# Patient Record
Sex: Female | Born: 1960 | Race: Black or African American | Hispanic: No | Marital: Single | Smoking: Never smoker
Health system: Southern US, Community
[De-identification: ages and names within clinical notes are randomized; demographics above are authoritative.]

## PROBLEM LIST (undated history)

## (undated) DIAGNOSIS — I10 Essential (primary) hypertension: Secondary | ICD-10-CM

## (undated) DIAGNOSIS — E1165 Type 2 diabetes mellitus with hyperglycemia: Secondary | ICD-10-CM

## (undated) DIAGNOSIS — N951 Menopausal and female climacteric states: Secondary | ICD-10-CM

## (undated) DIAGNOSIS — F4323 Adjustment disorder with mixed anxiety and depressed mood: Secondary | ICD-10-CM

## (undated) DIAGNOSIS — IMO0002 Reserved for concepts with insufficient information to code with codable children: Secondary | ICD-10-CM

## (undated) DIAGNOSIS — E7800 Pure hypercholesterolemia, unspecified: Secondary | ICD-10-CM

## (undated) DIAGNOSIS — B379 Candidiasis, unspecified: Secondary | ICD-10-CM

## (undated) HISTORY — PX: MYOMECTOMY: SHX85

## (undated) HISTORY — DX: Adjustment disorder with mixed anxiety and depressed mood: F43.23

## (undated) HISTORY — DX: Menopausal and female climacteric states: N95.1

## (undated) HISTORY — DX: Pure hypercholesterolemia, unspecified: E78.00

## (undated) HISTORY — DX: Reserved for concepts with insufficient information to code with codable children: IMO0002

## (undated) HISTORY — DX: Essential (primary) hypertension: I10

## (undated) HISTORY — DX: Candidiasis, unspecified: B37.9

## (undated) HISTORY — PX: CHOLECYSTECTOMY: SHX55

## (undated) HISTORY — DX: Type 2 diabetes mellitus with hyperglycemia: E11.65

## (undated) HISTORY — PX: ABDOMINAL HYSTERECTOMY: SHX81

---

## 2008-06-28 ENCOUNTER — Ambulatory Visit: Payer: Self-pay | Admitting: Diagnostic Radiology

## 2008-06-28 ENCOUNTER — Emergency Department (HOSPITAL_BASED_OUTPATIENT_CLINIC_OR_DEPARTMENT_OTHER): Admission: EM | Admit: 2008-06-28 | Discharge: 2008-06-28 | Payer: Self-pay | Admitting: Emergency Medicine
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
## 2017-01-25 ENCOUNTER — Institutional Professional Consult (permissible substitution): Payer: Managed Care, Other (non HMO) | Admitting: Neurology

## 2017-01-25 ENCOUNTER — Telehealth: Payer: Self-pay

## 2017-01-25 NOTE — Telephone Encounter (Signed)
Pt did not show for their appt with Dr. Athar today.  

## 2017-01-26 ENCOUNTER — Encounter: Payer: Self-pay | Admitting: Neurology

## 2017-03-10 ENCOUNTER — Ambulatory Visit (INDEPENDENT_AMBULATORY_CARE_PROVIDER_SITE_OTHER): Payer: Managed Care, Other (non HMO) | Admitting: Neurology

## 2017-03-10 ENCOUNTER — Encounter: Payer: Self-pay | Admitting: Neurology

## 2017-03-10 ENCOUNTER — Encounter (INDEPENDENT_AMBULATORY_CARE_PROVIDER_SITE_OTHER): Payer: Self-pay

## 2017-03-10 VITALS — BP 139/78 | HR 84 | Ht 60.0 in | Wt 194.0 lb

## 2017-03-10 DIAGNOSIS — Z820 Family history of epilepsy and other diseases of the nervous system: Secondary | ICD-10-CM | POA: Diagnosis not present

## 2017-03-10 DIAGNOSIS — G4719 Other hypersomnia: Secondary | ICD-10-CM | POA: Diagnosis not present

## 2017-03-10 DIAGNOSIS — E669 Obesity, unspecified: Secondary | ICD-10-CM | POA: Diagnosis not present

## 2017-03-10 DIAGNOSIS — G4733 Obstructive sleep apnea (adult) (pediatric): Secondary | ICD-10-CM

## 2017-03-10 DIAGNOSIS — R351 Nocturia: Secondary | ICD-10-CM

## 2017-03-10 NOTE — Patient Instructions (Addendum)
Based on your symptoms and your exam I believe you are still at risk for obstructive sleep apnea or OSA, and I think we should proceed with a sleep study to determine whether you do or do not still have OSA and how severe it is. If you have more than mild OSA, I want you to consider treatment with CPAP. Please remember, the risks and ramifications of moderate to severe obstructive sleep apnea or OSA are: Cardiovascular disease, including congestive heart failure, stroke, difficult to control hypertension, arrhythmias, and even type 2 diabetes has been linked to untreated OSA. Sleep apnea causes disruption of sleep and sleep deprivation in most cases, which, in turn, can cause recurrent headaches, problems with memory, mood, concentration, focus, and vigilance. Most people with untreated sleep apnea report excessive daytime sleepiness, which can affect their ability to drive. Please do not drive if you feel sleepy.   I will likely see you back after your sleep study to go over the test results and where to go from there. We will call you after your sleep study to advise about the results (most likely, you will hear from Lafonda Mosses, my nurse) and to set up an appointment at the time, as necessary.    Our sleep lab administrative assistant, Alvis Lemmings will meet with you or call you to schedule your sleep study. If you don't hear back from her by about 2 weeks please feel free to call her at [phone]. This is her direct line and please leave a message with your phone number to call back if you get the voicemail box. She will call back as soon as possible.
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
## 2017-04-11 ENCOUNTER — Telehealth: Payer: Self-pay | Admitting: Neurology

## 2017-04-11 DIAGNOSIS — G4733 Obstructive sleep apnea (adult) (pediatric): Secondary | ICD-10-CM

## 2017-04-11 NOTE — Telephone Encounter (Signed)
[employer] denied Split sleep and approved HST.  Can I get an order for HST? °

## 2017-04-25 ENCOUNTER — Ambulatory Visit (INDEPENDENT_AMBULATORY_CARE_PROVIDER_SITE_OTHER): Payer: Managed Care, Other (non HMO) | Admitting: Neurology

## 2017-04-25 DIAGNOSIS — G4733 Obstructive sleep apnea (adult) (pediatric): Secondary | ICD-10-CM

## 2017-05-02 ENCOUNTER — Telehealth: Payer: Self-pay | Admitting: Neurology

## 2017-05-02 DIAGNOSIS — G4733 Obstructive sleep apnea (adult) (pediatric): Secondary | ICD-10-CM

## 2017-05-02 NOTE — Procedures (Signed)
  [facility] [address] [place], [place] [address] NAME: Cathy Noble, Cathy Noble      DOB: [birth year] MEDICAL RECORD [id number]   DOS: 04/25/17 REFERRING PHYSICIAN: Suzann Hedgecock, PA-C STUDY PERFORMED: Home Sleep Study HISTORY: 56 year old woman with a history of diabetes, hypertension, hyperlipidemia, and morbid obesity, who was previously diagnosed with OSA. She has not used CPAP in over a year and needs re-evaluation. Her Epworth sleepiness score is 13 out of 24.   STUDY RESULTS: Total Recording:     8 Hours, 52 Minutes Total Apnea/Hypopnea Index (AHI):  37.1/Hour Average Oxygen Saturation:      94% Lowest Oxygen Saturation:       72%  Time Oxygen Saturation Below 88%:  6 % (37 min) Average Heart Rate:     75 BPM IMPRESSION: OSA RECOMMENDATION: This study demonstrates severe obstructive sleep apnea, with a total AHI of 37.1/hour, and O2 nadir of 72%. Treatment with positive airway pressure in the form of CPAP is recommended. This will require a full night titration study to optimize therapy. Other treatment options may include avoidance of supine sleep position along with weight loss, upper airway or jaw surgery in selected patients or the use of an oral appliance in certain patients. ENT evaluation and/or consultation with a maxillofacial surgeon or dentist may be feasible in some instances. Please note that untreated obstructive sleep apnea carries additional perioperative morbidity. Patients with significant obstructive sleep apnea should receive perioperative PAP therapy and the surgeons and particularly the anesthesiologist should be informed of the diagnosis and the severity of the sleep disordered breathing. The patient should be cautioned not to drive, work at heights, or operate dangerous or heavy equipment when tired or sleepy. Review and reiteration of good sleep hygiene measures should be pursued with any patient. I certify that I have reviewed the raw  data recording prior to the issuance of this report in accordance with the standards of the American Academy of Sleep Medicine (AASM). Huston Foley, MD, PhD [facility]) Diplomat, ABPN (Neurology and Sleep)

## 2017-05-02 NOTE — Telephone Encounter (Signed)
Could not do result note, I believe HST order placed by Dr. Lucia Gaskins on my behalf? Patient referred by PCP, seen by me on 03/10/17, HST on 04/25/17:  Please call and notify the patient that the recent home sleep test did suggest the diagnosis of severe obstructive sleep apnea and that I recommend treatment for this in the form of CPAP (she has not had supplies or used her old CPAP in over a year). I will request an overnight sleep study for proper titration and mask fitting. Please explain to patient and arrange for a CPAP titration study. I have placed an order in the chart. Thanks, and please route to [facility] for scheduling.   Huston Foley, MD, PhD [facility])

## 2017-05-03 NOTE — Telephone Encounter (Signed)
I called pt. I advised pt that Dr. Frances Furbish reviewed their sleep study results and found that pt has severe osa and recommends treatment with a cpap. Dr. Frances Furbish recommends that pt return for a repeat sleep study in order to properly titrate the cpap and ensure a good mask fit. Pt is agreeable to returning for a titration study. I advised pt that our sleep lab will file with pt's insurance and call pt to schedule the sleep study when we hear back from the pt's insurance regarding coverage of this sleep study. Pt verbalized understanding of results. Pt had no questions at this time but was encouraged to call back if questions arise.

## 2017-05-16 ENCOUNTER — Telehealth: Payer: Self-pay | Admitting: Neurology

## 2017-05-16 DIAGNOSIS — G4733 Obstructive sleep apnea (adult) (pediatric): Secondary | ICD-10-CM

## 2017-05-16 NOTE — Telephone Encounter (Signed)
[employer] denied CPAP titration °

## 2017-05-17 NOTE — Telephone Encounter (Signed)
We will set patient up with autoPAP at home, as insurance denied in house titration study for SEVERE OSA. Pls process order and notify patient and set up FU in 10 weeks with me or NP.

## 2017-05-19 NOTE — Telephone Encounter (Signed)
Unable to get in contact with patient. Rn call patients home number listed, and it stated pt was unreachable. Rn will continue to call back. Rn wanted to discuss about set up autopap at home.

## 2017-05-20 ENCOUNTER — Telehealth: Payer: Self-pay | Admitting: Neurology

## 2017-05-20 NOTE — Telephone Encounter (Signed)
Attempted call to the patient but the number we had on file states the patient was unreachable. I have contacted her sister who provided me with an updated number for the patient. I have called the new number [phone] at this time but the voicemail box has not been set up and there was no answer. Will attempt to call the patient at another time.

## 2017-05-20 NOTE — Telephone Encounter (Signed)
Attempted call to the patient but the number we had on file states the patient was unreachable. I have contacted her sister who provided me with an updated number for the patient. I have called the new number [phone] at this time but the voicemail box has not been set up and there was no answer. Will attempt to call the patient at another time.  

## 2017-05-23 NOTE — Telephone Encounter (Signed)
Called the patient and was able to discuss that insurance denied the cpap titration test. I informed her that Dr Frances FurbishAthar is recommending that we start her on a auto CPAP. I reviewed PAP compliance expectations with the pt. Pt is agreeable to starting a CPAP. I advised pt that an order will be sent to a DME, [facility], and [facility] will call the pt within about one week after they file with the pt's insurance. [facility] will show the pt how to use the machine, fit for masks, and troubleshoot the CPAP if needed. A follow up appt was made for insurance purposes with Dr. Frances FurbishAthar on Aug 17, 2017 at 11:30 am. Pt verbalized understanding to arrive 15 minutes early and bring their CPAP. A letter with all of this information in it will be mailed to the pt as a reminder. I verified with the pt that the address we have on file is correct. Pt verbalized understanding of results. Pt had no questions at this time but was encouraged to call back if questions arise.

## 2017-06-30 ENCOUNTER — Telehealth: Payer: Self-pay | Admitting: Neurology

## 2017-06-30 NOTE — Telephone Encounter (Signed)
We do not complete PAs for cpaps. I have reached out to [facility] and asked them to call her to discuss.

## 2017-06-30 NOTE — Telephone Encounter (Signed)
I called pt to discuss, no answer and no VM set up.  I have no idea what an intake number is. Is pt asking for [facility]'s number? If so, it is [phone].  I will attempt pt again later.

## 2017-06-30 NOTE — Telephone Encounter (Signed)
Pt is asking for a call back with the Intake # that she was given once before but has misplaced.

## 2017-06-30 NOTE — Telephone Encounter (Signed)
Pt called back, said she has waited too long to get in touch with [facility] and they are requesting a PA. Pt said her father passed, she was too preoccupied and forgot about it

## 2017-06-30 NOTE — Telephone Encounter (Signed)
Received this notice from [facility]: "Cathy Noble spoke with her this morning and explained that the Auth we received expired and that she is in the process of getting it renewed and will call her back to schedule by the first of the week if not by tomorrow."

## 2017-08-17 ENCOUNTER — Ambulatory Visit: Payer: Self-pay | Admitting: Neurology

## 2017-11-03 ENCOUNTER — Ambulatory Visit: Payer: Self-pay | Admitting: Neurology

## 2017-12-08 ENCOUNTER — Ambulatory Visit: Payer: Self-pay | Admitting: Neurology

## 2017-12-09 ENCOUNTER — Encounter: Payer: Self-pay | Admitting: Neurology

## 2017-12-27 ENCOUNTER — Emergency Department (HOSPITAL_BASED_OUTPATIENT_CLINIC_OR_DEPARTMENT_OTHER): Payer: Managed Care, Other (non HMO)

## 2017-12-27 ENCOUNTER — Other Ambulatory Visit: Payer: Self-pay

## 2017-12-27 ENCOUNTER — Encounter (HOSPITAL_BASED_OUTPATIENT_CLINIC_OR_DEPARTMENT_OTHER): Payer: Self-pay

## 2017-12-27 ENCOUNTER — Emergency Department (HOSPITAL_BASED_OUTPATIENT_CLINIC_OR_DEPARTMENT_OTHER)
Admission: EM | Admit: 2017-12-27 | Discharge: 2017-12-27 | Disposition: A | Discharge status: Home / Self Care | Payer: Managed Care, Other (non HMO) | Attending: Emergency Medicine | Admitting: Emergency Medicine

## 2017-12-27 DIAGNOSIS — E7800 Pure hypercholesterolemia, unspecified: Secondary | ICD-10-CM | POA: Diagnosis not present

## 2017-12-27 DIAGNOSIS — I10 Essential (primary) hypertension: Secondary | ICD-10-CM | POA: Insufficient documentation

## 2017-12-27 DIAGNOSIS — Z7984 Long term (current) use of oral hypoglycemic drugs: Secondary | ICD-10-CM | POA: Diagnosis not present

## 2017-12-27 DIAGNOSIS — E119 Type 2 diabetes mellitus without complications: Secondary | ICD-10-CM | POA: Diagnosis not present

## 2017-12-27 DIAGNOSIS — Z7982 Long term (current) use of aspirin: Secondary | ICD-10-CM | POA: Diagnosis not present

## 2017-12-27 DIAGNOSIS — Z79899 Other long term (current) drug therapy: Secondary | ICD-10-CM | POA: Diagnosis not present

## 2017-12-27 DIAGNOSIS — L0390 Cellulitis, unspecified: Secondary | ICD-10-CM

## 2017-12-27 DIAGNOSIS — M79671 Pain in right foot: Secondary | ICD-10-CM

## 2017-12-27 MED ORDER — CEPHALEXIN 500 MG PO CAPS: 500.0000 mg | ORAL_CAPSULE | Freq: Two times a day (BID) | ORAL | 0 refills | Status: AC

## 2017-12-27 MED FILL — CEPHALEXIN 500 MG CAPSULE: 500 | 7 days supply | Qty: 14 | Fill #0

## 2017-12-27 NOTE — Discharge Instructions (Signed)
You are seen today for right foot pain.  It appears that you likely have an infection called cellulitis on your foot.  We have given you a prescription for antibiotics to take for this.  You can take Tylenol or ibuprofen as needed for pain.  Please come back if the redness or pain gets worse or you develop a fever chills, nausea. Take care!

## 2017-12-27 NOTE — ED Provider Notes (Signed)
[facility] EMERGENCY DEPARTMENT Provider Note   CSN: [id number] Arrival date & time: 12/27/17  1220     History   Chief Complaint Chief Complaint  Patient presents with  . Foot Pain    HPI Cathy Noble is a 57 y.o. female here for RIGHT foot pain  Right foot pain: Patient notes that she has had pain on the top part of her foot since she woke up from sleep yesterday morning.  She notes that she has no trauma or inciting event.  The only thing that she can think of that she was climbing a ladder at her job on Sunday, she was wearing tennis shoes and did not hit her feet at this time.  She notes that whenever she flexes or extends her foot today makes the pain worse.  She took aspirin yesterday and it did help a little bit.  She has noticed that the top of the foot has been more red and swollen.  She denies any fevers, chills, nausea, vomiting.  HPI  Past Medical History:  Diagnosis Date  . Adjustment disorder with mixed anxiety and depressed mood   . Benign essential hypertension   . High cholesterol   . Menopause syndrome   . Uncontrolled type 2 diabetes mellitus (HCC)   . Yeast infection     There are no active problems to display for this patient.   Past Surgical History:  Procedure Laterality Date  . ABDOMINAL HYSTERECTOMY    . CHOLECYSTECTOMY    . MYOMECTOMY       OB History   None      Home Medications    Prior to Admission medications   Medication Sig Start Date End Date Taking? Authorizing Provider  aspirin EC 81 MG tablet Take 81 mg by mouth daily.    [provider]  cephALEXin (KEFLEX) 500 MG capsule Take 1 capsule (500 mg total) by mouth 2 (two) times daily for 7 days. 12/27/17 01/03/18  Beaulah Dinning, MD  dapagliflozin propanediol (FARXIGA) 5 MG TABS tablet Take 5 mg by mouth daily.    [provider]  ergocalciferol (VITAMIN D2) 50000 units capsule Take 50,000 Units by mouth once a week.    [provider]   FLUoxetine (PROZAC) 20 MG capsule Take 20 mg by mouth daily.    [provider]  losartan-hydrochlorothiazide (HYZAAR) 100-12.5 MG tablet Take 1 tablet by mouth daily.    [provider]  pravastatin (PRAVACHOL) 80 MG tablet Take 80 mg by mouth daily.    [provider]  sitaGLIPtin-metformin (JANUMET) 50-1000 MG tablet Take 1 tablet by mouth 2 (two) times daily with a meal.    [provider]    Family History Family History  Problem Relation Age of Onset  . Sleep apnea Brother     Social History Social History   Tobacco Use  . Smoking status: Never Smoker  . Smokeless tobacco: Never Used  Substance Use Topics  . Alcohol use: Yes    Alcohol/week: 0.6 oz    Types: 1 Shots of liquor per week    Comment: socially  . Drug use: No     Allergies   Sulfa antibiotics and Codeine   Review of Systems Review of Systems  Constitutional: Negative for fatigue and fever.  Respiratory: Negative for chest tightness, shortness of breath and wheezing.   Cardiovascular: Negative for chest pain, palpitations and leg swelling.  Musculoskeletal: Negative for joint swelling.  Right foot pain     Physical Exam Updated Vital Signs BP 127/80 (BP Location: Right Arm)   Pulse 80   Temp 98.3 F (36.8 C) (Oral)   Resp 18   Ht 5' (1.524 m)   Wt 90.7 kg (200 lb)   SpO2 98%   BMI 39.06 kg/m   Physical Exam  Constitutional: She is oriented to person, place, and time. She appears well-developed and well-nourished.  HENT:  Head: Normocephalic.  Eyes: Pupils are equal, round, and reactive to light.  Neck: Normal range of motion.  Cardiovascular: Normal rate and regular rhythm.  Pulmonary/Chest: Effort normal.  Abdominal: She exhibits no distension.  Musculoskeletal:       Right ankle: Swelling: mild swelling on medial and lateral malleolus. Tenderness: Tender to palpation of dorsal midfoot.       Left ankle: Normal.  RightAnkle &  Foot: Inspection: visible swelling on the dorsal midfoot extending to the medial and lateral malleolus. No cchymosis, erythema, ulcers, calluses, blister Palpation: Tenderness on lateral and medial malleolus as well as the dorsal midfoot ROM: Full in plantarflexion, dorsiflexion, inversion, and eversion of the foot; flexion and extension of the toes Strength: 5/5 in all directions. Sensation: intact Vascular: intact w/ dorsalis pedis & posterior tibialis pulses 2+   Neurological: She is alert and oriented to person, place, and time.  Skin: Skin is warm and dry. Capillary refill takes less than 2 seconds.  Psychiatric: She has a normal mood and affect.     ED Treatments / Results  Labs (all labs ordered are listed, but only abnormal results are displayed) Labs Reviewed - No data to display  EKG None  Radiology Dg Foot Complete Right  Result Date: 12/27/2017 CLINICAL DATA:  Right foot pain and swelling, no injury. EXAM: RIGHT FOOT COMPLETE - 3+ VIEW COMPARISON:  None. FINDINGS: Healed fractures of the second and third proximal phalangeal shafts. There may be healed fractures involving the fourth and fifth proximal phalangeal shafts as well. Healed fracture of the distal aspect of the fifth metatarsal. No acute osseous abnormality. Soft tissue swelling. Calcaneal spurs. IMPRESSION: 1. No acute osseous abnormality. 2. Soft tissue swelling. Electronically Signed   By: Leanna Battles M.D.   On: 12/27/2017 12:53    Procedures Procedures (including critical care time)  Medications Ordered in ED Medications - No data to display   Initial Impression / Assessment and Plan / ED Course  I have reviewed the triage vital signs and the nursing notes.  Pertinent labs & imaging results that were available during my care of the patient were reviewed by me and considered in my medical decision making (see chart for details).    57-year-old female here for right foot pain.  No trauma or inciting  event.  X-ray showing no bony abnormalities.  Dorsal aspect of right midfoot does appear to be slightly swollen and erythematous, no appreciable fluctuance to suggest abscess.  Patient most likely has cellulitis.  Prescription given for Keflex.  Will take Tylenol ibuprofen as needed for pain.  Return precautions discussed.  She will follow with her PCP.  Patient stable for discharge at this time.  Final Clinical Impressions(s) / ED Diagnoses   Final diagnoses:  Foot pain, right  Cellulitis, unspecified cellulitis site    ED Discharge Orders        Ordered    cephALEXin (KEFLEX) 500 MG capsule  2 times daily     12/27/17 1356       Shakevia Sarris, MacArthur  M, MD 12/27/17 1406    Pricilla LovelessGoldston, Scott, MD 12/27/17 1524

## 2017-12-27 NOTE — ED Triage Notes (Signed)
Pt c/o pain to right foot started yesterday-denies pain-NAD-steady gait

## 2019-03-18 IMAGING — CR DG FOOT COMPLETE 3+V*R*
3 series · 3 of 3 positions shown · non-contrast
Comparison: None.

CLINICAL DATA: Right foot pain and swelling, no injury.

EXAM:
RIGHT FOOT COMPLETE - 3+ VIEW

[t foot ap right]
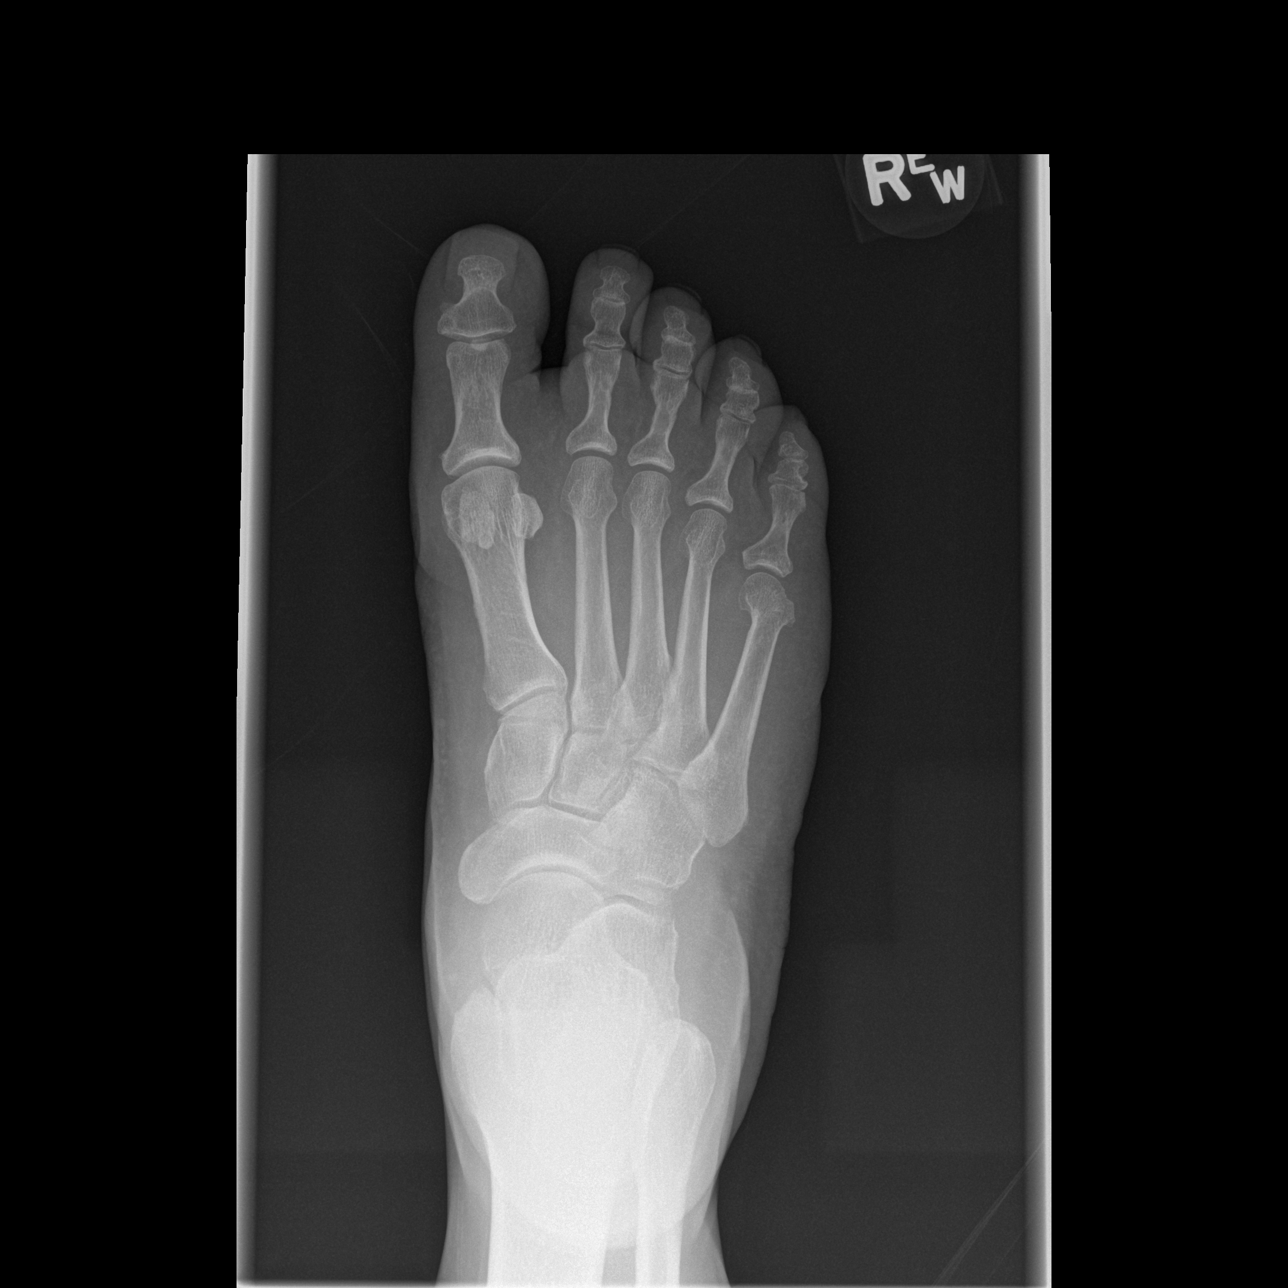

[t foot oblique right]
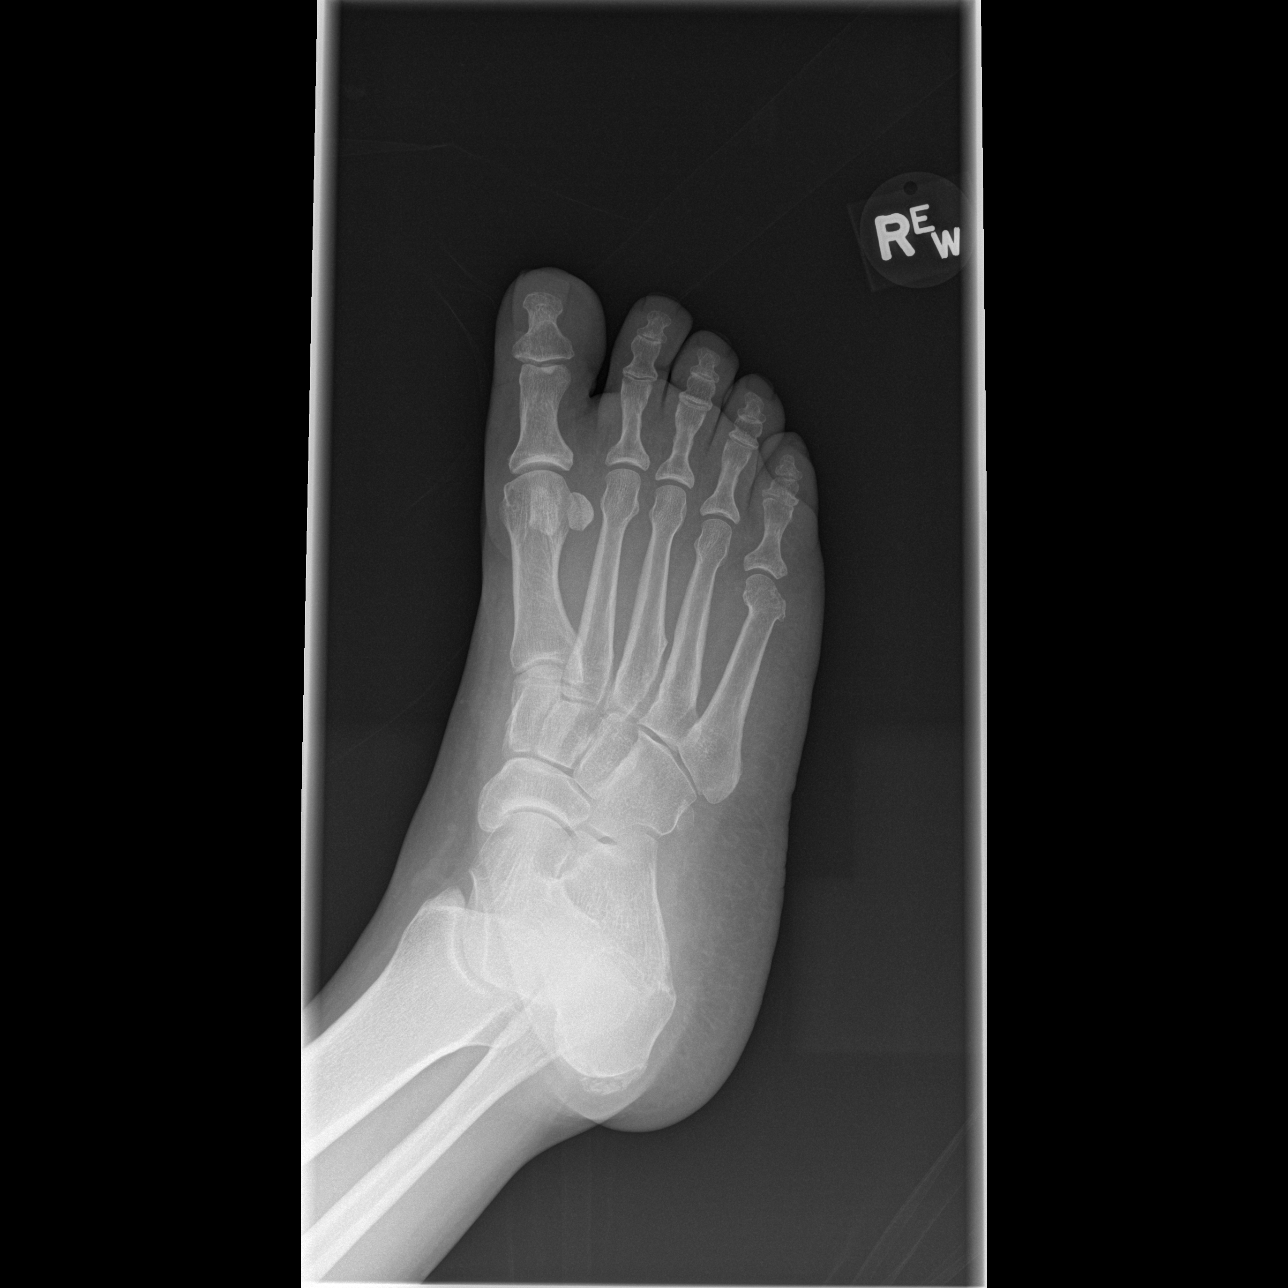

[t foot lat right]
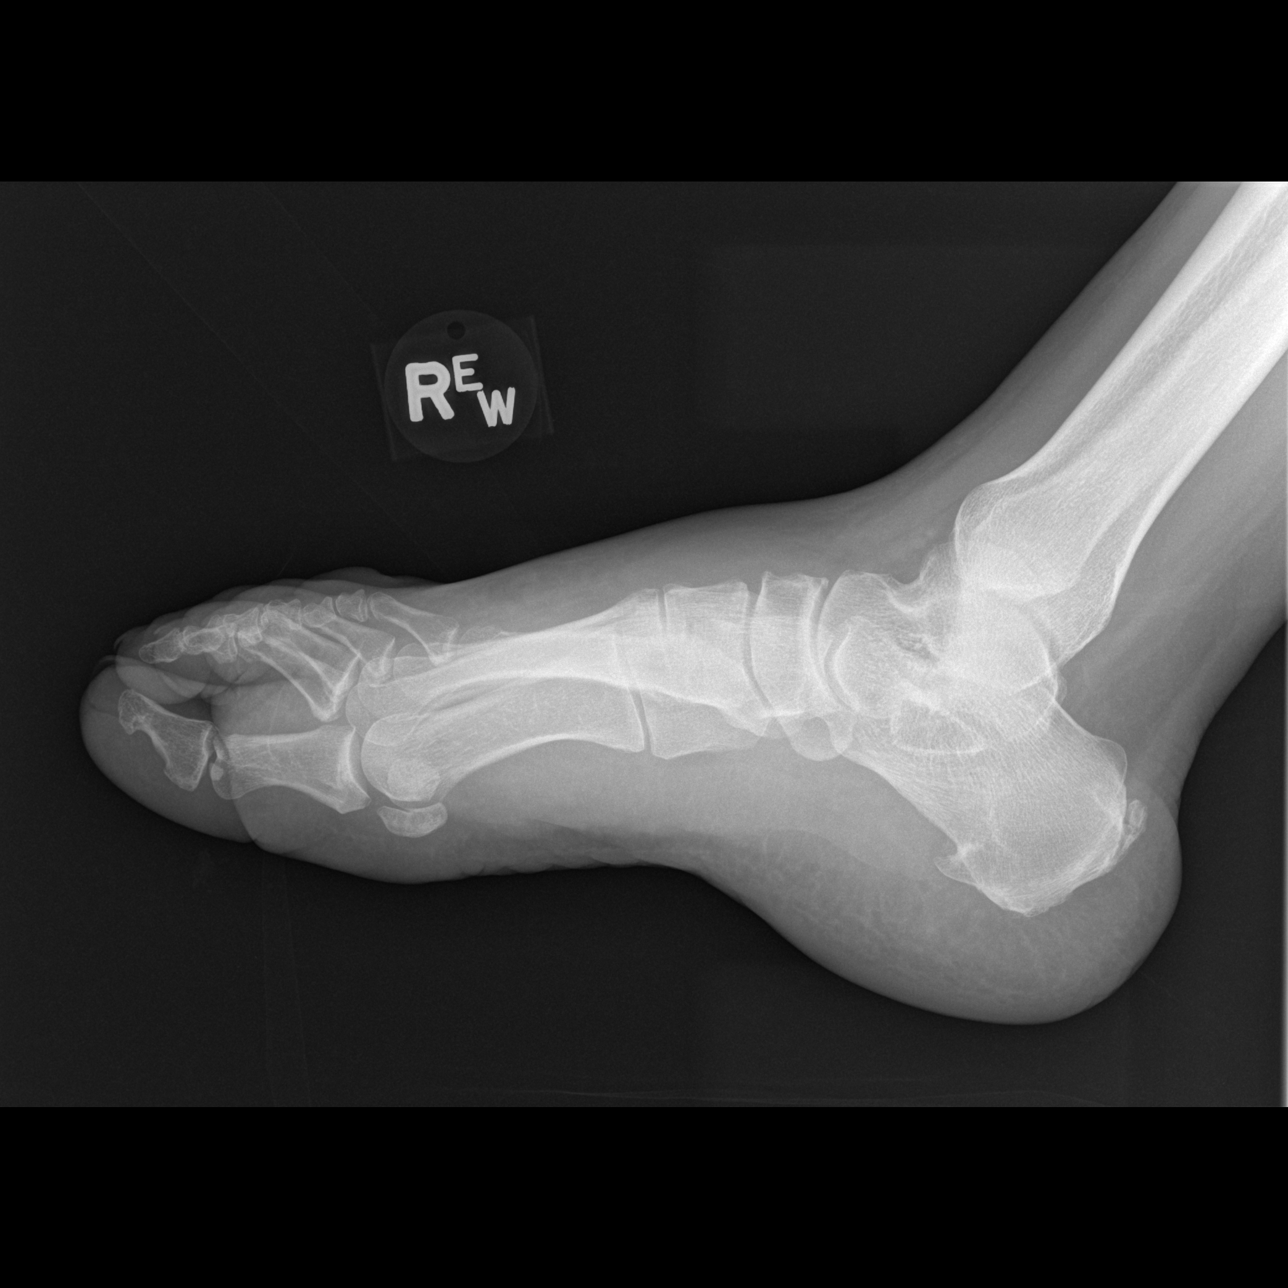

[3 of 3 positions shown; findings below may reference images not displayed]

FINDINGS: Healed fractures of the second and third proximal phalangeal shafts.
There may be healed fractures involving the fourth and fifth
proximal phalangeal shafts as well. Healed fracture of the distal
aspect of the fifth metatarsal. No acute osseous abnormality. Soft
tissue swelling. Calcaneal spurs.
IMPRESSION: 1. No acute osseous abnormality.
2. Soft tissue swelling.
